# Patient Record
Sex: Female | Born: 1970 | Race: White | Hispanic: No | Marital: Single | Smoking: Never smoker
Health system: Southern US, Community
[De-identification: ages and names within clinical notes are randomized; demographics above are authoritative.]

## PROBLEM LIST (undated history)

## (undated) DIAGNOSIS — R32 Unspecified urinary incontinence: Secondary | ICD-10-CM

## (undated) DIAGNOSIS — R569 Unspecified convulsions: Secondary | ICD-10-CM

## (undated) DIAGNOSIS — G039 Meningitis, unspecified: Secondary | ICD-10-CM

## (undated) DIAGNOSIS — R413 Other amnesia: Secondary | ICD-10-CM

## (undated) HISTORY — PX: OTHER SURGICAL HISTORY: SHX169

## (undated) HISTORY — DX: Meningitis, unspecified: G03.9

## (undated) HISTORY — DX: Other amnesia: R41.3

## (undated) HISTORY — DX: Unspecified urinary incontinence: R32

## (undated) HISTORY — DX: Unspecified convulsions: R56.9

---

## 2013-04-27 ENCOUNTER — Ambulatory Visit (INDEPENDENT_AMBULATORY_CARE_PROVIDER_SITE_OTHER): Payer: Medicare Other | Admitting: Neurology

## 2013-04-27 ENCOUNTER — Encounter: Payer: Self-pay | Admitting: Neurology

## 2013-04-27 ENCOUNTER — Encounter (INDEPENDENT_AMBULATORY_CARE_PROVIDER_SITE_OTHER): Payer: Self-pay

## 2013-04-27 VITALS — BP 118/79 | HR 64 | Ht 65.0 in | Wt 145.0 lb

## 2013-04-27 DIAGNOSIS — R569 Unspecified convulsions: Secondary | ICD-10-CM

## 2013-04-27 DIAGNOSIS — G039 Meningitis, unspecified: Secondary | ICD-10-CM

## 2013-04-27 DIAGNOSIS — F79 Unspecified intellectual disabilities: Secondary | ICD-10-CM | POA: Insufficient documentation

## 2013-04-27 MED ORDER — DIVALPROEX SODIUM ER 500 MG PO TB24: 1000.0000 mg | ORAL_TABLET | Freq: Every day | ORAL | Status: DC

## 2013-04-27 NOTE — Patient Instructions (Addendum)
Tapering off Depakote gradually,  she is currently taking Dilantin 100 mg 2 in the morning, one at noon, and 2 tablets every night  1st week, Dilantin 2 in the morning, one at noon, + one Depakote ER 500mg  every night.  2nd week, stop dilantin, start Depakote ER 500mg  2 tabs every night.  Return to clinic  in one month for lab evaluation. May 29, 2012, afternoon before 4pm  Return to follow up in 3 months

## 2013-04-27 NOTE — Progress Notes (Signed)
[facility]  PATIENT: Susan Diaz DOB: [birth year]  HISTORICAL and Susan Diaz is a 42 years old right-handed Caucasian female, accompanied by her sister Clydie Braun,  at today's clinical visit, she is referred by her primary care physician Dr.Cammie Fulp for evaluation of seizure, and behavior issu.  Elisha and her sister [name] just moved from [place] to [place] since August 2014, she has lived with her sister since 2010, before that, she lived with her brother, and her mother,   She was born normal, she suffered childhood meningitis at 8 months,, began to have seizure, developmentally delayed ever since, she was able to finish her high school, never married, never hold a job.  She tends to over eat, has behavior issue, her weight was up to 270 pounds in 2010, which has much improved, sister reported that she has been managing her own seizure medications, Dilantin 100 mg 2 tablets in the morning, one tablet at noon, 2 tablets at evening.  She tends to have recurrent seizure, most recent one was 2 weeks ago, she becomes confused, sister reported her seizure is often triggered by chocolate, and caffeine,  but reviewed most recent laboratory evaluation, normal CBC, CMP, Dilantin level was only 1 point 8, suggesting that she has not compliant with her medications,  Clydie Braun is also very frustrated about Treva's behavior issues, she is forgetful, does not know where to put the dishes back into its place, even though she has done the same job over and over, sometimes get agitated, combative.  She watches TV most of time, the family has to keep a close eye on her, otherwise, she tends to overeat, there was no significant gait difficulty, she is tearful during today's interview,  REVIEW OF SYSTEMS: Full 14 system review of systems performed and notable only for behavior issue overeating, seizure, achy muscles, occasionally urinary incontinence  ALLERGIES: Allergies  Allergen Reactions  .  Penicillins     HOME MEDICATIONS: Dilantin 100 mg 2 tablets in the morning, one tablet at noon, 2 tablets at evening   PAST MEDICAL HISTORY: Past Medical History  Diagnosis Date  . Seizure   . Meningitis spinal   . Memory loss     PAST SURGICAL HISTORY: Past Surgical History  Procedure Laterality Date  . None      FAMILY HISTORY: Family History  Problem Relation Age of Onset  . Diabetes Mother   . Hypertension Sister   . Diabetes Maternal Grandmother   . Hyperlipidemia Sister     SOCIAL HISTORY:  History   Social History  . Marital Status: Single    Spouse Name: N/A    Number of Children: 0  . Years of Education: 12TH   Occupational History  . DISABLED    Social History Main Topics  . Smoking status: Never Smoker   . Smokeless tobacco: Not on file  . Alcohol Use: Not on file  . Drug Use: No  . Sexual Activity: Not on file   Other Topics Concern  . Not on file   Social History Narrative  . No narrative on file     PHYSICAL EXAM   Filed Vitals:   04/27/13 1035  BP: 118/79  Pulse: 64  Height: 5\' 5"  (1.651 m)  Weight: 145 lb (65.772 kg)    Not recorded    Body mass index is 24.13 kg/(m^2).   Generalized: In no acute distress  Neck: Supple, no carotid bruits   Cardiac: Regular rate rhythm  Pulmonary: Clear  to auscultation bilaterally  Musculoskeletal: No deformity  Neurological examination  Mentation: tearful, compliance with physical exam.  Cranial nerve II-XII: Pupils were equal round reactive to light extraocular movements were full, Visual field were full on confrontational test. Bilateral fundi were sharp.  Facial sensation and strength were normal. Hearing was intact to finger rubbing bilaterally. Uvula tongue midline.  head turning and shoulder shrug and were normal and symmetric.Tongue protrusion into cheek strength was normal. She has gingiva hypertrophy  Motor: normal tone, bulk and strength.  Sensory: not  reliable.  Coordination: Normal finger to nose, heel-to-shin bilaterally there was no truncal ataxia  Gait:  Hold her left arm in elbow flexion, dragging her left leg some.  Deep tendon reflexes: Brachioradialis 2/2, biceps 2/2, triceps 2/2, patellar 2/2, Achilles 2/2, plantar responses were flexor bilaterally.   DIAGNOSTIC DATA (LABS, IMAGING, TESTING) - I reviewed patient records, labs, notes, testing and imaging myself where available.  ASSESSMENT AND PLAN   42 years old Caucasian female, which childhood meningitis, now presenting with frequent seizure, also behavior issues,  1. complete evaluation with MRI of the brain with and without contrast, EEG 2 she has been on long-term Dilantin treatment,. supposed to take 500 mg a day,  the level was only 1.8, likely she has not been compliant with her medications,  3 After discuss with her sister, we decided to switch her to Depakote er 500 mg 2 tablets every night, for easy compliance, also was able to check level to monitoring her compliance, and her behavior issues, 4 return to clinic with Eber Jones in 3 months.            Levert Feinstein, M.D. Ph.D.  [facility] [address], [address] [place], [place] [address] [phone]

## 2013-04-28 ENCOUNTER — Ambulatory Visit (INDEPENDENT_AMBULATORY_CARE_PROVIDER_SITE_OTHER): Payer: Medicare Other | Admitting: Radiology

## 2013-04-28 ENCOUNTER — Other Ambulatory Visit (HOSPITAL_COMMUNITY)
Admission: RE | Admit: 2013-04-28 | Discharge: 2013-04-28 | Disposition: A | Discharge status: Home / Self Care | Payer: Medicare Other | Source: Ambulatory Visit | Attending: Obstetrics & Gynecology | Admitting: Obstetrics & Gynecology

## 2013-04-28 ENCOUNTER — Other Ambulatory Visit: Payer: Self-pay | Admitting: Obstetrics & Gynecology

## 2013-04-28 DIAGNOSIS — F79 Unspecified intellectual disabilities: Secondary | ICD-10-CM

## 2013-04-28 DIAGNOSIS — Z124 Encounter for screening for malignant neoplasm of cervix: Secondary | ICD-10-CM | POA: Insufficient documentation

## 2013-04-28 DIAGNOSIS — R569 Unspecified convulsions: Secondary | ICD-10-CM

## 2013-04-28 DIAGNOSIS — G039 Meningitis, unspecified: Secondary | ICD-10-CM

## 2013-04-28 DIAGNOSIS — Z1151 Encounter for screening for human papillomavirus (HPV): Secondary | ICD-10-CM | POA: Insufficient documentation

## 2013-04-28 LAB — CBC WITH DIFFERENTIAL
Basos: 1 %
Eos: 2 %
Eosinophils Absolute: 0.1 10*3/uL (ref 0.0–0.4)
HCT: 41.6 % (ref 34.0–46.6)
Lymphocytes Absolute: 1.3 10*3/uL (ref 0.7–3.1)
MCH: 33.3 pg — ABNORMAL HIGH (ref 26.6–33.0)
MCV: 95 fL (ref 79–97)
Monocytes Absolute: 0.3 10*3/uL (ref 0.1–0.9)
Monocytes: 7 %
Neutrophils Absolute: 2.1 10*3/uL (ref 1.4–7.0)
RBC: 4.39 x10E6/uL (ref 3.77–5.28)
WBC: 3.8 10*3/uL (ref 3.4–10.8)

## 2013-04-28 LAB — COMPREHENSIVE METABOLIC PANEL
Albumin/Globulin Ratio: 1.6 (ref 1.1–2.5)
Albumin: 4.4 g/dL (ref 3.5–5.5)
Alkaline Phosphatase: 78 IU/L (ref 39–117)
BUN/Creatinine Ratio: 16 (ref 9–23)
BUN: 12 mg/dL (ref 6–24)
CO2: 27 mmol/L (ref 18–29)
Creatinine, Ser: 0.73 mg/dL (ref 0.57–1.00)
GFR calc Af Amer: 116 mL/min/{1.73_m2} (ref >59)
GFR calc non Af Amer: 100 mL/min/{1.73_m2} (ref >59)
Globulin, Total: 2.7 g/dL (ref 1.5–4.5)
Sodium: 145 mmol/L — ABNORMAL HIGH (ref 134–144)
Total Bilirubin: <0.2 mg/dL (ref 0.0–1.2)

## 2013-04-28 LAB — VALPROIC ACID LEVEL: Valproic Acid Lvl: <4 ug/mL — ABNORMAL LOW (ref 50–100)

## 2013-04-28 LAB — AMMONIA: Ammonia: 40 ug/dL (ref 19–87)

## 2013-04-28 NOTE — Progress Notes (Signed)
Quick Note:  Please call patient, mild elevated K, of unknown clinical significance. ______

## 2013-05-01 NOTE — Procedures (Signed)
HISTORY: 42 years old female, with childhood meningitis, now presenting with frequent seizures, and behavior issues, she is not compliant with her Dilantin,  TECHNIQUE:  16 channel EEG was performed based on standard 10-16 international system. One channel was dedicated to EKG, which has demonstrates normal sinus rhythm of 60 beats per minutes.  Upon awakening, the posterior background activity was well-developed, in alpha range,8 Hz,  with amplitude of 30 microvoltage, reactive to eye opening and closure. There was frequent frontal muscle artifact    Photic stimulation was performed, which induced a symmetric photic driving.  There was occasional appearance of sharp waves with phase reversal at F8, spreading to adjacent T4.   Hyperventilation was not performed.  the patient was able to achieve stage II sleep, as evident by sleep spindles, and K complexes .  CONCLUSION: This is  awake and asleep EEG.  There is no electrodiagnostic evidence of epileptiform discharge with phase reversal at F8, spreading to adjacent T4, suggesting the patient is prone to develop a partial seizure

## 2013-05-01 NOTE — Progress Notes (Signed)
Quick Note:  Left message for sister with lab results, per Dr. Terrace Arabia. Told to call if further questions. ______

## 2013-05-23 ENCOUNTER — Ambulatory Visit
Admission: RE | Admit: 2013-05-23 | Discharge: 2013-05-23 | Disposition: A | Discharge status: Home / Self Care | Payer: Medicare Other | Source: Ambulatory Visit | Attending: Neurology | Admitting: Neurology

## 2013-05-23 DIAGNOSIS — R569 Unspecified convulsions: Secondary | ICD-10-CM

## 2013-05-23 DIAGNOSIS — F79 Unspecified intellectual disabilities: Secondary | ICD-10-CM

## 2013-05-23 DIAGNOSIS — G039 Meningitis, unspecified: Secondary | ICD-10-CM

## 2013-05-23 MED ORDER — GADOBENATE DIMEGLUMINE 529 MG/ML IV SOLN
13.0000 mL | Freq: Once | INTRAVENOUS | Status: AC | PRN
  Administered 2013-05-23: 13 mL via INTRAVENOUS

## 2013-07-27 ENCOUNTER — Ambulatory Visit: Payer: Medicare Other | Admitting: Nurse Practitioner

## 2013-08-08 ENCOUNTER — Emergency Department (HOSPITAL_COMMUNITY)
Admission: EM | Admit: 2013-08-08 | Discharge: 2013-08-08 | Disposition: A | Discharge status: Home / Self Care | Payer: Medicare Other | Attending: Emergency Medicine | Admitting: Emergency Medicine

## 2013-08-08 ENCOUNTER — Encounter (HOSPITAL_COMMUNITY): Payer: Self-pay | Admitting: Emergency Medicine

## 2013-08-08 DIAGNOSIS — Z880 Allergy status to penicillin: Secondary | ICD-10-CM | POA: Insufficient documentation

## 2013-08-08 DIAGNOSIS — Z79899 Other long term (current) drug therapy: Secondary | ICD-10-CM | POA: Insufficient documentation

## 2013-08-08 DIAGNOSIS — R21 Rash and other nonspecific skin eruption: Secondary | ICD-10-CM | POA: Insufficient documentation

## 2013-08-08 DIAGNOSIS — Z8669 Personal history of other diseases of the nervous system and sense organs: Secondary | ICD-10-CM | POA: Insufficient documentation

## 2013-08-08 DIAGNOSIS — R569 Unspecified convulsions: Secondary | ICD-10-CM

## 2013-08-08 DIAGNOSIS — G40909 Epilepsy, unspecified, not intractable, without status epilepticus: Secondary | ICD-10-CM | POA: Insufficient documentation

## 2013-08-08 LAB — CBC WITH DIFFERENTIAL/PLATELET
BASOS ABS: 0 10*3/uL (ref 0.0–0.1)
BASOS PCT: 0 % (ref 0–1)
EOS PCT: 0 % (ref 0–5)
Eosinophils Absolute: 0 10*3/uL (ref 0.0–0.7)
HEMATOCRIT: 37.6 % (ref 36.0–46.0)
Hemoglobin: 13.3 g/dL (ref 12.0–15.0)
Lymphocytes Relative: 11 % — ABNORMAL LOW (ref 12–46)
Lymphs Abs: 0.6 10*3/uL — ABNORMAL LOW (ref 0.7–4.0)
MCH: 34.4 pg — ABNORMAL HIGH (ref 26.0–34.0)
MCHC: 35.4 g/dL (ref 30.0–36.0)
MCV: 97.2 fL (ref 78.0–100.0)
MONO ABS: 0.4 10*3/uL (ref 0.1–1.0)
Monocytes Relative: 7 % (ref 3–12)
Neutro Abs: 4.2 10*3/uL (ref 1.7–7.7)
Neutrophils Relative %: 81 % — ABNORMAL HIGH (ref 43–77)
PLATELETS: 123 10*3/uL — AB (ref 150–400)
RBC: 3.87 MIL/uL (ref 3.87–5.11)
RDW: 12.4 % (ref 11.5–15.5)
WBC: 5.2 10*3/uL (ref 4.0–10.5)

## 2013-08-08 LAB — URINALYSIS, ROUTINE W REFLEX MICROSCOPIC
Bilirubin Urine: NEGATIVE
GLUCOSE, UA: NEGATIVE mg/dL
Hgb urine dipstick: NEGATIVE
KETONES UR: 15 mg/dL — AB
Leukocytes, UA: NEGATIVE
NITRITE: NEGATIVE
Protein, ur: NEGATIVE mg/dL
Specific Gravity, Urine: 1.016 (ref 1.005–1.030)
Urobilinogen, UA: 0.2 mg/dL (ref 0.0–1.0)
pH: 8 (ref 5.0–8.0)

## 2013-08-08 LAB — CBG MONITORING, ED: GLUCOSE-CAPILLARY: 83 mg/dL (ref 70–99)

## 2013-08-08 LAB — BASIC METABOLIC PANEL
BUN: 10 mg/dL (ref 6–23)
CO2: 27 mEq/L (ref 19–32)
CREATININE: 0.62 mg/dL (ref 0.50–1.10)
Calcium: 8.9 mg/dL (ref 8.4–10.5)
Chloride: 98 mEq/L (ref 96–112)
GFR calc Af Amer: >90 mL/min (ref >90)
Glucose, Bld: 82 mg/dL (ref 70–99)
Potassium: 4.1 mEq/L (ref 3.7–5.3)
SODIUM: 137 meq/L (ref 137–147)

## 2013-08-08 LAB — PHENYTOIN LEVEL, TOTAL: Phenytoin Lvl: <2.5 ug/mL — ABNORMAL LOW (ref 10.0–20.0)

## 2013-08-08 LAB — VALPROIC ACID LEVEL: Valproic Acid Lvl: 91 ug/mL (ref 50.0–100.0)

## 2013-08-08 MED ORDER — SODIUM CHLORIDE 0.9 % IV BOLUS (SEPSIS)
1000.0000 mL | Freq: Once | INTRAVENOUS | Status: AC
  Administered 2013-08-08: 1000 mL via INTRAVENOUS

## 2013-08-08 NOTE — ED Notes (Signed)
Family reports waking up this am with pt making her bed, was altered and appeared postictal. Pt has hx of seizures, has been taking meds as prescribed. Family reports noticing rash to face, torso and arms that was not present last night.

## 2013-08-08 NOTE — ED Provider Notes (Signed)
CSN: [id number]     Arrival date & time 08/08/13  0722 History   First MD Initiated Contact with Patient 08/08/13 0830     Chief Complaint  Patient presents with  . Seizures     (Consider location/radiation/quality/duration/timing/severity/associated sxs/prior Treatment) HPI.... level V caveat for post ictal state.   Patient has known seizure disorder and is presently on Depakote extended release 500 mg 2 tablets at bedtime.  Sister reports that patient had a tonic/clonic seizure this morning.  She is now feeling better. Also reports a rash on her face and chest.  No fever, chills, stiff neck, new neurological deficits. Severity is mild to moderate.  Past Medical History  Diagnosis Date  . Seizure   . Meningitis spinal   . Memory loss    Past Surgical History  Procedure Laterality Date  . None     Family History  Problem Relation Age of Onset  . Diabetes Mother   . Hypertension Sister   . Diabetes Maternal Grandmother   . Hyperlipidemia Sister    History  Substance Use Topics  . Smoking status: Never Smoker   . Smokeless tobacco: Not on file  . Alcohol Use: Not on file   OB History   Grav Para Term Preterm Abortions TAB SAB Ect Mult Living                 Review of Systems  All other systems reviewed and are negative.      Allergies  Penicillins  Home Medications   Current Outpatient Rx  Name  Route  Sig  Dispense  Refill  . CRANBERRY EXTRACT PO   Oral   Take 3 tablets by mouth daily.          . divalproex (DEPAKOTE ER) 500 MG 24 hr tablet   Oral   Take 2 tablets (1,000 mg total) by mouth daily.   60 tablet   12   . phenytoin (DILANTIN) 100 MG ER capsule   Oral   Take 200 mg by mouth daily as needed (For seizure prevention).          BP 105/66  Pulse 65  Temp(Src) 98 F (36.7 C) (Oral)  Resp 17  SpO2 100% Physical Exam  Nursing note and vitals reviewed. Constitutional: She is oriented to person, place, and time. She appears  well-developed and well-nourished.  HENT:  Head: Normocephalic and atraumatic.  Eyes: Conjunctivae and EOM are normal. Pupils are equal, round, and reactive to light.  Neck: Normal range of motion. Neck supple.  Cardiovascular: Normal rate, regular rhythm and normal heart sounds.   Pulmonary/Chest: Effort normal and breath sounds normal.  Abdominal: Soft. Bowel sounds are normal.  Musculoskeletal: Normal range of motion.  Neurological: She is alert and oriented to person, place, and time.  Skin: Skin is warm and dry.  Petechiae around both orbits.    Benign appearing macular rash on upper chest  Psychiatric: She has a normal mood and affect. Her behavior is normal.    ED Course  Procedures (including critical care time) Labs Review Labs Reviewed  PHENYTOIN LEVEL, TOTAL - Abnormal; Notable for the following:    Phenytoin Lvl <2.5 (*)    All other components within normal limits  CBC WITH DIFFERENTIAL - Abnormal; Notable for the following:    MCH 34.4 (*)    Platelets 123 (*)    Neutrophils Relative % 81 (*)    Lymphocytes Relative 11 (*)    Lymphs Abs 0.6 (*)  All other components within normal limits  URINALYSIS, ROUTINE W REFLEX MICROSCOPIC - Abnormal; Notable for the following:    Ketones, ur 15 (*)    All other components within normal limits  VALPROIC ACID LEVEL  BASIC METABOLIC PANEL  CBG MONITORING, ED   Imaging Review No results found.   EKG Interpretation None      MDM   Final diagnoses:  Seizure    Patient is hemodynamically stable. Valproic acid level within normal limits. Discussed findings with patient and her sister. Family is comfortable taking her home.    Donnetta HutchingBrian Dehaven Sine, MD 08/08/13 1136

## 2013-08-08 NOTE — ED Notes (Signed)
Notified RN of CBG 83 

## 2013-08-08 NOTE — Discharge Instructions (Signed)
Depakote level normal. Increase fluids.  Followup your regular doctors.

## 2013-09-20 ENCOUNTER — Telehealth: Payer: Self-pay | Admitting: Neurology

## 2013-09-20 MED ORDER — LAMOTRIGINE 25 MG PO TABS: ORAL_TABLET | ORAL | Status: DC

## 2013-09-20 MED ORDER — LEVETIRACETAM 750 MG PO TABS: 750.0000 mg | ORAL_TABLET | Freq: Two times a day (BID) | ORAL | Status: DC

## 2013-09-20 NOTE — Telephone Encounter (Addendum)
I called and spoke to sister of pt.  Pt had seizures 2 within last week? Not ill, has rash though (for about 2 mo, thinks from new seizure med?  Although has been on this since change back in 04/2013).  Face/ forehead burned, scabbed.  This has progressively gotten worse.  Pt as per below knocked her out 3.5 hours.  I offered appt tomorrow with Heide GuileLynn Lam, NP , she stated needed to be seen today.  Now on Depakote ER 500mg  tabs (2 tabs po at 2130). Has been compliant.

## 2013-09-20 NOTE — Telephone Encounter (Signed)
She had seizure today, May 6th, before that was March 24th, past seizure was prolonged, she was taken to hospital in March, Depakote level was 91, she is only taking Depakote ER 500 mg 2 tablets every night she is no longer taking Dilantin she continued to have behavior issues,.  I have suggested her sister keep Depakote ER 500 mg 2 tablets every night Keppra 750 mg twice a day, for better seizure control, but I worry about the side effect of behavior issues with Keppra  I will also add on Lamictal, starting with  25 mg, titrating to 100 mg twice a day,,  Sister, that she has rashes all over, this has been going on for 2 months,  We scheduled her to followup with our clinic nurse practitioner Larita FifeLynn 2:15 PM,May 7th 2015. ,

## 2013-09-20 NOTE — Telephone Encounter (Signed)
Patient's sister calling to request an appointment this afternoon with Dr. Terrace ArabiaYan, states tha patient had a seizure today which knocked her out for 3 and a half hours and just now became coherent. Please call and advise.

## 2013-09-21 ENCOUNTER — Encounter (INDEPENDENT_AMBULATORY_CARE_PROVIDER_SITE_OTHER): Payer: Self-pay

## 2013-09-21 ENCOUNTER — Encounter: Payer: Self-pay | Admitting: Nurse Practitioner

## 2013-09-21 ENCOUNTER — Ambulatory Visit (INDEPENDENT_AMBULATORY_CARE_PROVIDER_SITE_OTHER): Payer: Medicare Other | Admitting: Nurse Practitioner

## 2013-09-21 VITALS — BP 94/64 | HR 70 | Wt 152.0 lb

## 2013-09-21 DIAGNOSIS — R21 Rash and other nonspecific skin eruption: Secondary | ICD-10-CM

## 2013-09-21 DIAGNOSIS — R569 Unspecified convulsions: Secondary | ICD-10-CM

## 2013-09-21 DIAGNOSIS — G039 Meningitis, unspecified: Secondary | ICD-10-CM

## 2013-09-21 DIAGNOSIS — F79 Unspecified intellectual disabilities: Secondary | ICD-10-CM

## 2013-09-21 MED ORDER — LEVETIRACETAM 750 MG PO TABS: 750.0000 mg | ORAL_TABLET | Freq: Two times a day (BID) | ORAL | Status: DC

## 2013-09-21 NOTE — Patient Instructions (Addendum)
Continue Depakote ER 500 mg 2 tablets every night.  Keppra 750 mg twice a day, for better seizure control. Continue Lamictal, starting with 25 mg, titrating to 100 mg twice a day as previously directed.    The Lamictal may help with mood stabilization, hopefully as the dose as increased.  If behavior continues to be a problem, we recommend Psychiatric consultation.  Please see PCP or Dermatology for rash.  Keep next scheduled follow up appointment, call sooner with problems.     Epilepsy Epilepsy is a disorder in which a person has repeated seizures over time. A seizure is a release of abnormal electrical activity in the brain. Seizures can cause a change in attention, behavior, or the ability to remain awake and alert (altered mental status). Seizures often involve uncontrollable shaking (convulsions).  Most people with epilepsy lead normal lives. However, people with epilepsy are at an increased risk of falls, accidents, and injuries. Therefore, it is important to begin treatment right away. CAUSES  Epilepsy has many possible causes. Anything that disturbs the normal pattern of brain cell activity can lead to seizures. This may include:   Head injury.  Birth trauma.  High fever as a child.  Stroke.  Bleeding into or around the brain.  Certain drugs.  Prolonged low oxygen, such as what occurs after CPR efforts.  Abnormal brain development.  Certain illnesses, such as meningitis, encephalitis (brain infection), malaria, and other infections.  An imbalance of nerve signaling chemicals (neurotransmitters).  SIGNS AND SYMPTOMS  The symptoms of a seizure can vary greatly from one person to another. Right before a seizure, you may have a warning (aura) that a seizure is about to occur. An aura may include the following symptoms:  Fear or anxiety.  Nausea.  Feeling like the room is spinning (vertigo).  Vision changes, such as seeing flashing lights or spots. Common  symptoms during a seizure include:  Abnormal sensations, such as an abnormal smell or a bitter taste in the mouth.   Sudden, general body stiffness.   Convulsions that involve rhythmic jerking of the face, arm, or leg on one or both sides.   Sudden change in consciousness.   Appearing to be awake but not responding.   Appearing to be asleep but cannot be awakened.   Grimacing, chewing, lip smacking, drooling, tongue biting, or loss of bowel or bladder control. After a seizure, you may feel sleepy for a while. DIAGNOSIS  Your health care provider will ask about your symptoms and take a medical history. Descriptions from any witnesses to your seizures will be very helpful in the diagnosis. A physical exam, including a detailed neurological exam, is necessary. Various tests may be done, such as:   An electroencephalogram (EEG). This is a painless test of your brain waves. In this test, a diagram is created of your brain waves. These diagrams can be interpreted by a specialist.  An MRI of the brain.   A CT scan of the brain.   A spinal tap (lumbar puncture, LP).  Blood tests to check for signs of infection or abnormal blood chemistry. TREATMENT  There is no cure for epilepsy, but it is generally treatable. Once epilepsy is diagnosed, it is important to begin treatment as soon as possible. For most people with epilepsy, seizures can be controlled with medicines. The following may also be used:  A pacemaker for the brain (vagus nerve stimulator) can be used for people with seizures that are not well controlled by medicine.  Surgery on the brain. For some people, epilepsy eventually goes away. HOME CARE INSTRUCTIONS   Follow your health care provider's recommendations on driving and safety in normal activities.  Get enough rest. Lack of sleep can cause seizures.  Only take over-the-counter or prescription medicines as directed by your health care provider. Take any  prescribed medicine exactly as directed.  Avoid any known triggers of your seizures.  Keep a seizure diary. Record what you recall about any seizure, especially any possible trigger.   Make sure the people you live and work with know that you are prone to seizures. They should receive instructions on how to help you. In general, a witness to a seizure should:   Cushion your head and body.   Turn you on your side.   Avoid unnecessarily restraining you.   Not place anything inside your mouth.   Call for emergency medical help if there is any question about what has occurred.   Follow up with your health care provider as directed. You may need regular blood tests to monitor the levels of your medicine.  SEEK MEDICAL CARE IF:   You develop signs of infection or other illness. This might increase the risk of a seizure.   You seem to be having more frequent seizures.   Your seizure pattern is changing.  SEEK IMMEDIATE MEDICAL CARE IF:   You have a seizure that does not stop after a few moments.   You have a seizure that causes any difficulty in breathing.   You have a seizure that results in a very severe headache.   You have a seizure that leaves you with the inability to speak or use a part of your body.  Document Released: 05/04/2005 Document Revised: 02/22/2013 Document Reviewed: 12/14/2012 [facility] Patient Information 2014 [employer] [place], [place].

## 2013-09-21 NOTE — Progress Notes (Signed)
PATIENT: Susan Diaz DOB: 1970-11-24  REASON FOR VISIT: sooner follow up for seizure HISTORY FROM: patient  HISTORY OF PRESENT ILLNESS: Susan Diaz is a 43 years old right-handed Caucasian female, accompanied by her sister [name], at today's clinical visit, she is referred by her primary care physician Dr. Cain Saupeammie Fulp for evaluation of seizure, and behavior issu.  Susan Diaz and her sister [name] just moved from [place] to [place] since August 2014, she has lived with her sister since 2010, before that, she lived with her brother, and her mother. 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  She tends to over eat, has behavior issue, her weight was up to 270 pounds in 2010, which has much improved, sister reported that she has been managing her own seizure medications, Dilantin 100 mg 2 tablets in the morning, one tablet at noon, 2 tablets at evening. She tends to have recurrent seizure, most recent one was 2 weeks ago, she becomes confused, sister reported her seizure is often triggered by chocolate, and caffeine, but reviewed most recent laboratory evaluation, normal CBC, CMP, Dilantin level was only 1 point 8, suggesting that she has not compliant with her medications.  [name] is also very frustrated about [name]'s behavior issues, she is forgetful, does not know where to put the dishes back into its place, even though she has done the same job over and over, sometimes get agitated, combative.  She likes to do puzzles, watch TV, the family has to keep a close eye on her, otherwise, she tends to overeat, there was no significant gait difficulty, she is tearful during today's interview.   She was taken to hospital in March, Depakote level was 91, she is only taking Depakote ER 500 mg 2 tablets every night she is no longer taking Dilantin she continued to  have behavior issues.   Normal MRI brain (with and without). Incidental right frontal, parasagittal developmental venous anomaly.   Phone note 09/20/13:  She had seizure May 6th, before that was March 24th, past seizure was prolonged, 3 hours unconscious post-ictal. Dr. Terrace ArabiaYan suggested her sister keep Depakote ER 500 mg 2 tablets every night, Keppra 750 mg twice a day, for better seizure control, but I worry about the side effect of behavior issues with Keppra.  I will also add on Lamictal, starting with 25 mg, titrating to 100 mg twice a day.  Sister states that she has rash all over, this has been going on for 2 months.  UPDATE 5/715 (LL): Patient is brought in today for sooner follow up after having seizure yesterday.  She is accompanied by her brother-in-law.  Dr. Terrace ArabiaYan spoke to pt. Sister on phone with medicine changes, she has had 2 doses of new meds Keppra and Lamictal so far.  Behavior issues continue to be challenging for family.  Patient is very confused and combative after seizures.  Patient is in good spirits today.  Rash has been present all over body for 2 months, itches, no new medications since it started, no new detergents. They have not seen PCP for this.  REVIEW OF SYSTEMS: Full 14 system review of systems performed and notable only for:  Rash, seizure  ALLERGIES: Allergies  Allergen Reactions  . Penicillins Other (See Comments)    unknown    HOME MEDICATIONS: Outpatient Prescriptions Prior to Visit  Medication Sig  Dispense Refill  . CRANBERRY EXTRACT PO Take 3 tablets by mouth daily.       . divalproex (DEPAKOTE ER) 500 MG 24 hr tablet Take 2 tablets (1,000 mg total) by mouth daily.  60 tablet  12  . lamoTRIgine (LAMICTAL) 25 MG tablet One tab po bid xone week, then 2 tabs po bid xone week, then 3 tabs po bid.  100 tablet  0  . levETIRAcetam (KEPPRA) 750 MG tablet Take 1 tablet (750 mg total) by mouth 2 (two) times daily.  60 tablet  0   No facility-administered medications  prior to visit.   PHYSICAL EXAM  Filed Vitals:   09/21/13 1418  BP: 94/64  Pulse: 70  Weight: 152 lb (68.947 kg)   Body mass index is 25.29 kg/(m^2).  Generalized: In no acute distress  Neck: Supple, no carotid bruits  Cardiac: Regular rate rhythm  Pulmonary: Clear to auscultation bilaterally  Musculoskeletal: No deformity  Skin: Dry, Generalized maculopapular rash on face, torso, arms and legs  Neurological examination  Mentation: alert, pleasant, compliance with physical exam.  Cranial nerve II-XII: Pupils were equal round reactive to light extraocular movements were full, Visual field were full on confrontational test.  Facial sensation and strength were normal. Hearing was intact to finger rubbing bilaterally. Uvula tongue midline. head turning and shoulder shrug and were normal and symmetric.Tongue protrusion into cheek strength was normal. She has gingiva hypertrophy  Motor: normal tone, bulk and strength.  Sensory: not reliable.  Coordination: Normal finger to nose, heel-to-shin bilaterally there was no truncal ataxia  Gait: Hold her left arm in elbow flexion, dragging her left leg some.  Deep tendon reflexes: Brachioradialis 2/2, biceps 2/2, triceps 2/2, patellar 2/2, Achilles 2/2, plantar responses were flexor bilaterally.   ASSESSMENT AND PLAN 43 y.o. Caucasian female, which childhood meningitis, now presenting with frequent seizure, also behavior issues.  She has been on long-term Dilantin treatment, supposed to take 500 mg a day, the level was only 1.8, likely she has not been compliant with her medications. Added Keppra and Lamictal yesterday.  PLAN: As per previous plan,  Continue Depakote ER 500 mg 2 tablets every night.  Keppra 750 mg twice a day, for better seizure control. Continue Lamictal, starting with 25 mg, titrating to 100 mg twice a day as previously directed.   The Lamictal may help with mood stabilization, hopefully as the dose as increased.  If  behavior continues to be a problem, recommend Psychiatric consultation. Please see PCP or Dermatology for rash. Keep next scheduled follow up appointment in July, call sooner with problems.  Ronal FearLYNN E. Davan Nawabi, MSN, NP-C 09/21/2013, 4:19 PM [facility] [address], [address] [place], [place] [address] [phone]  Note: This document was prepared with digital dictation and possible smart phrase technology. Any transcriptional errors that result from this process are unintentional.

## 2013-11-15 ENCOUNTER — Telehealth: Payer: Self-pay | Admitting: *Deleted

## 2013-11-15 NOTE — Telephone Encounter (Signed)
Spoke with patient to r/s appointment, CM has never seen patient before but NP LL has, patient was r/s to 12/12/13 to see NP LL.

## 2013-11-24 ENCOUNTER — Ambulatory Visit: Payer: Medicare Other | Admitting: Nurse Practitioner

## 2013-12-12 ENCOUNTER — Ambulatory Visit (INDEPENDENT_AMBULATORY_CARE_PROVIDER_SITE_OTHER): Payer: Medicare Other | Admitting: Nurse Practitioner

## 2013-12-12 ENCOUNTER — Encounter: Payer: Self-pay | Admitting: Nurse Practitioner

## 2013-12-12 VITALS — BP 98/72 | HR 56 | Ht 65.0 in | Wt 153.0 lb

## 2013-12-12 DIAGNOSIS — R569 Unspecified convulsions: Secondary | ICD-10-CM

## 2013-12-12 DIAGNOSIS — Z8661 Personal history of infections of the central nervous system: Secondary | ICD-10-CM

## 2013-12-12 DIAGNOSIS — F79 Unspecified intellectual disabilities: Secondary | ICD-10-CM

## 2013-12-12 NOTE — Patient Instructions (Addendum)
Continue Depakote ER 500 mg 2 tablets every night.  If she has any seizures, please notify the office.  We may have to add back one of the seizure medications or adjust the dose of Depakote. Follow up with Dr. Terrace Arabia in 4 months, sooner as needed.   Managing Seizure Triggers: Tips for Lifestyle Modification Adapted from the [facility], [facility], [place], [place] and the journal "Clinical Nursing Practice in Epilepsy", Spring 1994.  Developing plans to modify your lifestyle is an important part of seizure preparedness. It's a way that you, as a person with seizures or a parent of a child with seizures, can take charge and play an active role in your epilepsy care. The following tips are examples of what people can do to manage triggers. Some of these tips may require a change in behavior, others may be ways to adjust your environment or schedule so not everything happens at once. Before choosing tips to try, make sure you've assessed your situation and talked to your doctor and other health care professionals for their suggestions too. Please note that research on the effectiveness of many of these techniques is limited. Many of these tips are common sense suggestions or are from health care professionals and people with epilepsy as to what they have seen and tried.  Noises: People who think they are affected by noises should be sure to talk to their doctor about whether they have a form of 'reflex epilepsy' or if general noise or distraction may be a trigger in another way. People with true reflex epilepsy may respond to specific seizure medicines and should talk to their doctor. Try using earplugs or earphones, especially in noisy or crowded places. Try listening to relaxing music or sounds, or try distracting yourself by singing or focusing on another activity.  Bright, flashing or fluorescent lights: Use polarized or tinted glasses. Use natural  lighting when indoors. Focus on distant objects when riding in a car to avoid flickering lights or patterns. Avoid discos, strobe lights or flashing bulbs on holiday decorations. Use computer monitor with minimal contrast glare or use a screen filter. Consult with your doctor about other specific recommendations for computer use.  Sleep: Try to regulate sleeping habits so you have a consistent schedule and get enough sleep. Keep a log or diary of your sleep patterns, seizures and general well-being. Ask a partner or companion to record his or her observations too. Consider the following ideas to improve sleep.  . Discuss your medicine schedule with your doctor or nurse. Changing times or doses at night may help sleep. . Limit caffeine and try to avoid it after noon time or mid?afternoon at the latest. . Avoid alcohol and nicotine prior to sleep. . Limit working or studying late at night. Stop work at least one hour before bedtime to allow time to relax. . Exercise in the early evening if possible. . Take warm showers or have someone give you a back rub before bedtime to decrease muscle tension. . Try relaxation exercises before bedtime. . Limit naps and don't nap in the early evening. . If anxious or worried, talk to someone or write down your feelings before going to sleep. Put this away and deal with these worries or concerns in the morning! . If you can't fall asleep within 15 minutes get up and do something else for 15 minutes. Then go back to bed and try again. Don't toss and turn in bed all night.  Exercise: Regular exercise is good for everyone. Pace your exercise to avoid getting too tired or hyperventilation. Avoid exercising in the middle of the day during hot weather. Ask your doctor about any specific exercises you may need to avoid.  Hyperventilation: Try relaxation or slow breathing exercises when anxious or if you begin to hyperventilate. Pace your activity and  avoid sports that may trigger hyperventilation.  Diet: Regulate meal times and patterns around sleep, activity, and medication schedules. Usually taking medicines after food or around meals makes it easier to remember them and may lessen any stomach distress from side effects of medicines. Have a well-balanced diet and eat at consistent times to avoid long periods without food. If your appetite is poor, try small frequent meals instead of skipping meals. Avoid foods and drinks that may aggravate seizures. Not everyone is sensitive to foods, but if you are, talk to your doctor about how to modify your diet. If you are following a diet specifically for your epilepsy, be sure to follow the advice of your doctor and nutritionist.  Alcohol/Drugs: Avoid recreational drugs and talk to your doctor about use of alcohol. Avoid alcohol completely if you're going through high-risk times or have recently had surgery. If you choose to drink alcohol, use 'moderation', drink slowly, and have only one or two glasses at a time. Consider carefully what you drink, avoiding 'hard liquor' or mixed drinks that may have high alcohol content. If alcohol and drugs are a problem for you, talk to your doctor and get professional help.  Hormonal changes: Both men and women may notice a cyclical pattern to their seizures. Record seizures on a calendar and track them in relation to any changes in hormones. Women who are having menstrual cycles should track their cycle days. Women who have stopped having their menses should track other symptoms or changes, while women who are pregnant should track their pregnancy too. The use of hormonal medicines, such as contraceptives or birth control pills as well as hormonal replacement therapy, may affect seizures in some women, so record the dates and doses of these medicines.  NOTE: some seizure medicines may interfere with the effectiveness of hormonal contraceptives making unexpected or  unplanned pregnancy more likely. Be sure to talk to your doctor about all contraceptive use.When seizures cluster around menses or hormone changes, women should try to modify their lifestyle so other triggers don't occur during this high-risk time. Some women may use 'as needed' medicines to help treat seizures associated with menses. Note the use of these on your calendars and seizure preparedness plan.  Illness, fever, trauma: Notify your doctor if you become ill, have a fever, injure yourself seriously, or need other medicines such as antibiotics, painkillers, or cold medicines. Some people may notice that certain medicines can trigger seizures or interfere with seizure medicines. Fevers, other illnesses and injuries may also make you more susceptible and you'll need to monitor your seizures carefully. Try to limit other triggers during these times and talk to your doctor about what medicines you can use.  Stress, anxiety, depression: Emotional stress is a common trigger for some people, and stress can be a cause and symptom of mood problems such as anxiety and depression. Track your stress level and mood in relation to your seizures on your diary. During stressful times, consider ways to modify your lifestyle and manage stress better. . Try counseling to help cope with seizures or other problems. . Consider support groups for epilepsy, or groups for stress  management, therapy, and other support. . Write down feelings in a diary on a regular basis. It helps you get feelings out, rather than hold them in, and can help you see the issues more clearly. . Use 'time-out' periods. Just like kids may need a time-out when they are overwhelmed or acting out, so too do adults. Giving yourself a time-out allows you to take a step back from the stressor or situation and think about how best to address it. . Learn relaxation exercises, deep breathing, yoga, or other strategies that help with stress  and general well-being. . Tell your doctor and nurse how you feel. The effects of stress can be harmful to your seizures, and your life. When mood changes last longer than expected, you may need help from a mental health professional too. If you feel emotionally unsafe, call your doctor or go to an emergency room to be evaluated.

## 2013-12-12 NOTE — Progress Notes (Signed)
PATIENT: Susan Diaz DOB: 1970/12/21  REASON FOR VISIT: routine follow up for epilepsy HISTORY FROM: patient  HISTORY OF PRESENT ILLNESS: Susan Diaz is a 43 years old right-handed Caucasian female, accompanied by her sister Susan Diaz, at today's clinical visit, she is referred by her primary care physician Dr. Cain Saupe for evaluation of seizure, and behavior issu.  Jennings and her sister Susan Diaz just moved from [place] to [place] since August 2014, she has lived with her sister since 2010, before that, she lived with her brother, and her mother.  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  She tends to over eat, has behavior issue, her weight was up to 270 pounds in 2010, which has much improved, sister reported that she has been managing her own seizure medications, Dilantin 100 mg 2 tablets in the morning, one tablet at noon, 2 tablets at evening. She tends to have recurrent seizure, most recent one was 2 weeks ago, she becomes confused, sister reported her seizure is often triggered by chocolate, and caffeine, but reviewed most recent laboratory evaluation, normal CBC, CMP, Dilantin level was only 1 point 8, suggesting that she has not compliant with her medications.  Susan Diaz is also very frustrated about [name]'s behavior issues, she is forgetful, does not know where to put the dishes back into its place, even though she has done the same job over and over, sometimes get agitated, combative.  She likes to do puzzles, watch TV, the family has to keep a close eye on her, otherwise, she tends to overeat, there was no significant gait difficulty, she is tearful during today's interview. She was taken to hospital in March, Depakote level was 91, she is only taking Depakote ER 500 mg 2 tablets every night she is no longer taking Dilantin she continued to  have behavior issues.  Normal MRI brain (with and without). Incidental right frontal, parasagittal developmental venous anomaly.  Phone note 09/20/13: She had seizure May 6th, before that was March 24th, past seizure was prolonged, 3 hours unconscious post-ictal. Dr. Terrace Arabia suggested her sister keep Depakote ER 500 mg 2 tablets every night, Keppra 750 mg twice a day, for better seizure control, but I worry about the side effect of behavior issues with Keppra. I will also add on Lamictal, starting with 25 mg, titrating to 100 mg twice a day. Sister states that she has rash all over, this has been going on for 2 months.   UPDATE 5/715 (LL): Patient is brought in today for sooner follow up after having seizure yesterday. She is accompanied by her brother-in-law. Dr. Terrace Arabia spoke to pt. Sister on phone with medicine changes, she has had 2 doses of new meds Keppra and Lamictal so far. Behavior issues continue to be challenging for family. Patient is very confused and combative after seizures. Patient is in good spirits today. Rash has been present all over body for 2 months, itches, no new medications since it started, no new detergents. They have not seen PCP for this.   UPDATE 12/12/13 (LL):  Patient returns to the office for revisit for seizures since she is accompanied by her brother-in-law again today. Since last visit, she has not been taking Lamictal or Keppra. When asked why, her brother-in-law states that the medication ran out and there were no more refills. She has continued to take Depakote  ER 1000 mg at bedtime. She has not had any seizures since last visit. Rash cleared up after stopping Lamictal and Keppra. Patient sister and brother-in-law control her medication administration. Her behavior issues seemed to have calm down somewhat.  REVIEW OF SYSTEMS: Full 14 system review of systems performed and notable only for:  Tremors, seizure  ALLERGIES: Allergies  Allergen Reactions  . Penicillins Other (See  Comments)    unknown    HOME MEDICATIONS: Outpatient Prescriptions Prior to Visit  Medication Sig Dispense Refill  . CRANBERRY EXTRACT PO Take 3 tablets by mouth daily.       . divalproex (DEPAKOTE ER) 500 MG 24 hr tablet Take 2 tablets (1,000 mg total) by mouth daily.  60 tablet  12  . lamoTRIgine (LAMICTAL) 25 MG tablet One tab po bid xone week, then 2 tabs po bid xone week, then 3 tabs po bid.  100 tablet  0  . levETIRAcetam (KEPPRA) 750 MG tablet Take 1 tablet (750 mg total) by mouth 2 (two) times daily.  60 tablet  5   No facility-administered medications prior to visit.    PHYSICAL EXAM Filed Vitals:   12/12/13 1413  BP: 98/72  Pulse: 56  Height: 5\' 5"  (1.651 m)  Weight: 153 lb (69.4 kg)   Body mass index is 25.46 kg/(m^2).  Generalized: In no acute distress  Neck: Supple, no carotid bruits  Cardiac: Regular rate rhythm  Pulmonary: Clear to auscultation bilaterally  Musculoskeletal: No deformity   Neurological examination  Mentation: alert, pleasant, compliance with physical exam.  Cranial nerve II-XII: Pupils were equal round reactive to light extraocular movements were full, Visual field were full on confrontational test. Facial sensation and strength were normal. Hearing was intact to finger rubbing bilaterally. Uvula tongue midline. head turning and shoulder shrug and were normal and symmetric.Tongue protrusion into cheek strength was normal. She has gingiva hypertrophy  Motor: normal tone, bulk and strength.  Sensory: not reliable.  Coordination: Normal finger to nose, heel-to-shin bilaterally there was no truncal ataxia  Gait: Hold her left arm in elbow flexion, dragging her left leg some.   DIAGNOSTIC DATA (LABS, IMAGING, TESTING) - I reviewed patient records, labs, notes, testing and imaging myself where available.  Lab Results  Component Value Date   WBC 5.2 08/08/2013   HGB 13.3 08/08/2013   HCT 37.6 08/08/2013   MCV 97.2 08/08/2013   PLT 123* 08/08/2013       Component Value Date/Time   NA 137 08/08/2013 0901   NA 145* 04/27/2013 1141   K 4.1 08/08/2013 0901   CL 98 08/08/2013 0901   CO2 27 08/08/2013 0901   GLUCOSE 82 08/08/2013 0901   GLUCOSE 85 04/27/2013 1141   BUN 10 08/08/2013 0901   BUN 12 04/27/2013 1141   CREATININE 0.62 08/08/2013 0901   CALCIUM 8.9 08/08/2013 0901   PROT 7.1 04/27/2013 1141   AST 19 04/27/2013 1141   ALT 18 04/27/2013 1141   ALKPHOS 78 04/27/2013 1141   BILITOT <0.2 04/27/2013 1141   GFRNONAA >90 08/08/2013 0901   GFRAA >90 08/08/2013 0901    ASSESSMENT: 43 y.o. Caucasian female, with childhood meningitis, with seizures, also behavior issues. She had been on long-term Dilantin treatment, supposed to take 500 mg a day, the level was only 1.8, likely she had not been compliant with her medications. Her medications are controlled now by sister and brother-in-law.  She was started on Keppra and Lamictal 3 months ago and developed generalized rash,  family did not get these medications refilled and did not call the office for refills. She has been seizure-free on Depakote ER alone for about 2 months.  PLAN: I had a long discussion with the patient and family regarding her seizures and answered questions.  Continue Depakote ER 500 mg 2 tablets every night. Brother-in-law is instructed to contact the office if she has any seizures. Followup in 6 months with Dr. Terrace ArabiaYan, sooner as needed.  Tawny AsalLYNN E. Brynlee Pennywell, MSN, FNP-BC, A/GNP-C 12/12/2013, 2:32 PM [facility] [address], [address] [place], [place] [address] [phone]  Note: This document was prepared with digital dictation and possible smart phrase technology. Any transcriptional errors that result from this process are unintentional.

## 2014-02-12 ENCOUNTER — Emergency Department (HOSPITAL_COMMUNITY)
Admission: EM | Admit: 2014-02-12 | Discharge: 2014-02-13 | Disposition: A | Discharge status: Home / Self Care | Payer: Medicare Other | Attending: Emergency Medicine | Admitting: Emergency Medicine

## 2014-02-12 ENCOUNTER — Encounter (HOSPITAL_COMMUNITY): Payer: Self-pay | Admitting: Emergency Medicine

## 2014-02-12 ENCOUNTER — Emergency Department (HOSPITAL_COMMUNITY): Payer: Medicare Other

## 2014-02-12 DIAGNOSIS — S82899A Other fracture of unspecified lower leg, initial encounter for closed fracture: Secondary | ICD-10-CM | POA: Diagnosis not present

## 2014-02-12 DIAGNOSIS — G40909 Epilepsy, unspecified, not intractable, without status epilepticus: Secondary | ICD-10-CM | POA: Diagnosis not present

## 2014-02-12 DIAGNOSIS — R0602 Shortness of breath: Secondary | ICD-10-CM | POA: Diagnosis not present

## 2014-02-12 DIAGNOSIS — Z880 Allergy status to penicillin: Secondary | ICD-10-CM | POA: Insufficient documentation

## 2014-02-12 DIAGNOSIS — Y92009 Unspecified place in unspecified non-institutional (private) residence as the place of occurrence of the external cause: Secondary | ICD-10-CM | POA: Diagnosis not present

## 2014-02-12 DIAGNOSIS — Z79899 Other long term (current) drug therapy: Secondary | ICD-10-CM | POA: Insufficient documentation

## 2014-02-12 DIAGNOSIS — S8990XA Unspecified injury of unspecified lower leg, initial encounter: Secondary | ICD-10-CM | POA: Insufficient documentation

## 2014-02-12 DIAGNOSIS — S99929A Unspecified injury of unspecified foot, initial encounter: Secondary | ICD-10-CM

## 2014-02-12 DIAGNOSIS — Y939 Activity, unspecified: Secondary | ICD-10-CM | POA: Diagnosis not present

## 2014-02-12 DIAGNOSIS — R296 Repeated falls: Secondary | ICD-10-CM | POA: Diagnosis not present

## 2014-02-12 DIAGNOSIS — S99919A Unspecified injury of unspecified ankle, initial encounter: Secondary | ICD-10-CM | POA: Diagnosis present

## 2014-02-12 DIAGNOSIS — S82402A Unspecified fracture of shaft of left fibula, initial encounter for closed fracture: Secondary | ICD-10-CM

## 2014-02-12 NOTE — ED Notes (Signed)
Pt coming from home with c/o left ankle pain per the family. Family states they heard pt fall up stairs and found pt on the floor of her room. Pt does not remember the fall, but that is normal per the family. Pt c/o bilateral ankle pain and states she always has pain in her ankles. Pt's family says she was favoring her left ankle. Pt has good ROM and CMS is intact in both ankles

## 2014-02-12 NOTE — Discharge Instructions (Signed)
Please follow the directions provided.  Be sure to follow up with the bone doctor in the am.  You may take tylenol 650 mg by mouth every 4 hours or ibuprofen 400 mg by mouth every 6 hours for pain.  If you have any new or worsening concerns, don't hesitate to come back.    SEEK MEDICAL CARE IF:  You have continued pain or more swelling  The medications do not control the pain. SEEK IMMEDIATE MEDICAL CARE IF:  You develop severe pain in the leg or foot.  Your skin or nails below the injury turn blue or grey or feel cold or numb.

## 2014-02-12 NOTE — ED Provider Notes (Signed)
CSN: [id number]     Arrival date & time 02/12/14  2104 History  This chart was scribed for Susan Battiest, NP working with Hurman Horn, MD by Evon Slack, ED Scribe. This patient was seen in room TR06C/TR06C and the patient's care was started at 9:59 PM.    Chief Complaint  Patient presents with  . Ankle Injury  . Fall   The history is provided by the patient. No language interpreter was used.   HPI Comments: BERTINA GUTHRIDGE is a 43 y.o. female who presents to the Emergency Department complaining of left ankle injury onset this afternoon. She has associated swelling and bruising to the ankle. Family states she usually sits at her table and does puzzles.  They heard a "thud" upstairs and found pt on the floor with legs underneath her.  Does not appear to have fallen from height but since has been complaining of pain in the left ankle. They noticed swelling and states she is able to ambulate. She states that she always has ankle pain at baseline. Denies knee pain.  Past Medical History  Diagnosis Date  . Seizure   . Meningitis spinal   . Memory loss    Past Surgical History  Procedure Laterality Date  . None     Family History  Problem Relation Age of Onset  . Diabetes Mother   . Hypertension Sister   . Diabetes Maternal Grandmother   . Hyperlipidemia Sister    History  Substance Use Topics  . Smoking status: Never Smoker   . Smokeless tobacco: Not on file  . Alcohol Use: Yes   OB History   Grav Para Term Preterm Abortions TAB SAB Ect Mult Living                 Review of Systems  Respiratory: Positive for shortness of breath.   Musculoskeletal: Positive for arthralgias and joint swelling. Negative for gait problem.  Skin: Positive for color change.    Allergies  Penicillins  Home Medications   Prior to Admission medications   Medication Sig Start Date End Date Taking? Authorizing Provider  CRANBERRY EXTRACT PO Take 1 tablet by mouth daily.    Yes  Historical Provider, MD  divalproex (DEPAKOTE ER) 500 MG 24 hr tablet Take 2 tablets (1,000 mg total) by mouth daily. 04/27/13  Yes Levert Feinstein, MD   Triage Vitals: BP 99/63  Pulse 60  Temp(Src) 97.5 F (36.4 C) (Oral)  Resp 18  SpO2 100%  Physical Exam  Nursing note and vitals reviewed. Constitutional: She is oriented to person, place, and time. She appears well-developed and well-nourished. No distress.  HENT:  Head: Normocephalic and atraumatic.  No tenderness to palpation to head  Eyes: Conjunctivae and EOM are normal.  Neck: Neck supple. No tracheal deviation present.  Cardiovascular: Normal rate.   Pulses:      Dorsalis pedis pulses are 2+ on the right side, and 2+ on the left side.  Pulmonary/Chest: Effort normal. No respiratory distress.  Musculoskeletal: She exhibits tenderness.       Left ankle: She exhibits swelling. Tenderness. Lateral malleolus tenderness found.  Swelling and tender to palpation left lateral ankle, no bony tenderness to cervical, thoracic or lumbar spine, no  tenderness to hips or knees  Neurological: She is alert and oriented to person, place, and time. She has normal strength. A cranial nerve deficit is present. No sensory deficit. GCS eye subscore is 4. GCS verbal subscore is 5. GCS motor subscore  is 6.  5/5 strength on plantar and dorsi flexion, good motor and sensation  Skin: Skin is warm and dry.  Psychiatric: She has a normal mood and affect. Her behavior is normal.    ED Course  Procedures (including critical care time) DIAGNOSTIC STUDIES: Oxygen Saturation is 100% on RA, normal by my interpretation.    Labs Review Labs Reviewed - No data to display  Imaging Review Dg Ankle Complete Left  02/12/2014   CLINICAL DATA:  Fall, left ankle pain  EXAM: LEFT ANKLE COMPLETE - 3+ VIEW  COMPARISON:  None.  FINDINGS: Minimally displaced oblique distal fibular fracture.  Mild irregularity of the medial malleolus is likely a chronic posttraumatic  deformity.  The ankle mortise is otherwise intact.  The base of the fifth metatarsal is unremarkable.  Moderate soft tissue swelling overlying the lateral malleolus.  IMPRESSION: Minimally displaced oblique distal fibular fracture.   Electronically Signed   By: Charline Bills M.D.   On: 02/12/2014 23:13     EKG Interpretation None      MDM   Final diagnoses:  Fibula fracture, left, closed, initial encounter   Pt is 43 yo female with intellectual disability, cared for by sister.  Pain and swelling after fall from seated position.  Pain meds deferred due to pt's content affect and sister reports pt already received her night anti-epileptic meds.  Cam walker placed by ortho tech. Discharge instructions include symptomatic mgmt, instructions to call orthopedics in am for follow-up.  Family and pt are agreeable with plan.  Return precautions provided.  I personally performed the services described in this documentation, which was scribed in my presence. The recorded information has been reviewed and is accurate.  Filed Vitals:   02/12/14 2115 02/12/14 2346  BP: 99/63 101/64  Pulse: 60 60  Temp: 97.5 F (36.4 C) 97.6 F (36.4 C)  TempSrc: Oral Oral  Resp: 18 16  SpO2: 100% 100%   Meds given in ED:  Medications - No data to display  Discharge Medication List as of 02/12/2014 11:35 PM        Susan Battiest, NP 02/17/14 1610

## 2014-02-12 NOTE — ED Notes (Signed)
Ortho paged for [employer].

## 2014-02-21 NOTE — ED Provider Notes (Signed)
Medical screening examination/treatment/procedure(s) were performed by non-physician practitioner and as supervising physician I was immediately available for consultation/collaboration.   EKG Interpretation None       Eryanna Regal M Amyre Segundo, MD 02/21/14 2043 

## 2014-04-27 ENCOUNTER — Ambulatory Visit: Payer: Medicare Other | Admitting: Neurology

## 2014-05-14 ENCOUNTER — Other Ambulatory Visit: Payer: Self-pay | Admitting: Neurology

## 2014-05-14 ENCOUNTER — Ambulatory Visit: Payer: Medicare Other | Admitting: Neurology

## 2014-05-15 ENCOUNTER — Encounter: Payer: Self-pay | Admitting: Neurology

## 2014-05-15 ENCOUNTER — Ambulatory Visit (INDEPENDENT_AMBULATORY_CARE_PROVIDER_SITE_OTHER): Payer: Medicare Other | Admitting: Neurology

## 2014-05-15 VITALS — Wt 156.0 lb

## 2014-05-15 DIAGNOSIS — R569 Unspecified convulsions: Secondary | ICD-10-CM

## 2014-05-15 DIAGNOSIS — F79 Unspecified intellectual disabilities: Secondary | ICD-10-CM

## 2014-05-15 MED ORDER — DIVALPROEX SODIUM ER 500 MG PO TB24
ORAL_TABLET | ORAL | Status: DC
Start: 2014-05-15 — End: 2014-11-16

## 2014-05-15 NOTE — Progress Notes (Signed)
PATIENT: Susan Diaz DOB: 07/11/1970  REASON FOR VISIT: routine follow up for epilepsy HISTORY FROM: patient  HISTORY OF PRESENT ILLNESS: Susan Diaz is a 43 years old right-handed Caucasian female, accompanied by her sister Susan Diaz, at today's clinical visit, she is referred by her primary care physician Dr. Cain Saupeammie Fulp for evaluation of seizure, and behavior issues  Susan Diaz and her sister [name] just moved from [place] to [place] since August 2014, she has lived with her sister since 2010, before that, she lived with her brother, and her mother.   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   She tends to over eat, has behavior issue, her weight was up to 270 pounds in 2010, which has much improved, sister reported that she has been managing her own seizure medications, Dilantin 100 mg 2 tablets in the morning, one tablet at noon, 2 tablets at evening. She tends to have recurrent seizure, most recent one was 2 weeks ago, she becomes confused, sister reported her seizure is often triggered by chocolate, and caffeine, but reviewed most recent laboratory evaluation, normal CBC, CMP, Dilantin level was only 1. 8, suggesting that she has not compliant with her medications.   Her family is also very frustrated about Jasira's behavior issues, she is forgetful, does not know where to put the dishes back into its place, even though she has done the same job over and over, sometimes get agitated, combative.  She was switched from Dilantin to Depakote since 2014  Normal MRI brain (with and without) in January 2015 Incidental right frontal, parasagittal developmental venous anomaly.  Over the past 2 years, we have tried Depakote, along with Keppra, worsening behavior issues, Lamictal, rashes, now she is only taking Depakote ER 500 mg 2 tablets every  night  She continue has occasionally breakthrough seizures, March 20 fourth, Sep 20, 2013  UPDATE Dec 29th 2015: She came seen with her brother-in-law at today's visit, she is tolerating Depakote ER 500 mg every night, there was no significant side effect, She continued to have tendency to overeat, sometimes woke up in the middle of the night wandering around the house, has no recollection of the event, but there was no seizure-like activity noticed   REVIEW OF SYSTEMS: Full 14 system review of systems performed and notable only for:  Unexpected weight change, confusion, incontinence of bladder, rash  ALLERGIES: Allergies  Allergen Reactions  . Penicillins Other (See Comments)    unknown    HOME MEDICATIONS: Outpatient Prescriptions Prior to Visit  Medication Sig Dispense Refill  . CRANBERRY EXTRACT PO Take 1 tablet by mouth daily.     . divalproex (DEPAKOTE ER) 500 MG 24 hr tablet TAKE TWO TABLETS BY MOUTH ONCE DAILY 60 tablet 0   No facility-administered medications prior to visit.    PHYSICAL EXAM Filed Vitals:   05/15/14 1056  Weight: 156 lb (70.761 kg)   Body mass index is 25.96 kg/(m^2).  Generalized: In no acute distress  Neck: Supple, no carotid bruits  Cardiac: Regular rate rhythm  Pulmonary: Clear to auscultation bilaterally  Musculoskeletal: No deformity   Neurological examination  Mentation: alert, pleasant, compliance with physical exam.  Cranial nerve II-XII: Pupils were equal round reactive to light extraocular movements were full, Visual field were full on confrontational test. Facial sensation and strength were normal. Hearing was intact to  finger rubbing bilaterally. Uvula tongue midline. head turning and shoulder shrug and were normal and symmetric.Tongue protrusion into cheek strength was normal. She has gingiva hypertrophy  Motor: normal tone, bulk and strength.  Sensory: not reliable.  Coordination: Normal finger to nose, heel-to-shin bilaterally  there was no truncal ataxia  Gait: Hold her left arm in elbow flexion, dragging her left leg some.   DIAGNOSTIC DATA (LABS, IMAGING, TESTING) - I reviewed patient records, labs, notes, testing and imaging myself where available.  Lab Results  Component Value Date   WBC 5.2 08/08/2013   HGB 13.3 08/08/2013   HCT 37.6 08/08/2013   MCV 97.2 08/08/2013   PLT 123* 08/08/2013      Component Value Date/Time   NA 137 08/08/2013 0901   NA 145* 04/27/2013 1141   K 4.1 08/08/2013 0901   CL 98 08/08/2013 0901   CO2 27 08/08/2013 0901   GLUCOSE 82 08/08/2013 0901   GLUCOSE 85 04/27/2013 1141   BUN 10 08/08/2013 0901   BUN 12 04/27/2013 1141   CREATININE 0.62 08/08/2013 0901   CALCIUM 8.9 08/08/2013 0901   PROT 7.1 04/27/2013 1141   AST 19 04/27/2013 1141   ALT 18 04/27/2013 1141   ALKPHOS 78 04/27/2013 1141   BILITOT <0.2 04/27/2013 1141   GFRNONAA >90 08/08/2013 0901   GFRAA >90 08/08/2013 0901    ASSESSMENT/PLAN: 43 y.o. Caucasian female, with childhood meningitis, with seizures, also behavior issues. She had been on long-term Dilantin treatment, but has developed osteoporosis, toxicity in the past, she lives with her sister, tolerating Depakote ER 500 mg 2 tablets every night, but has occasionally breakthrough seizures ,   Increased Depakote ER 500 mg to 3 tablets every night Return to clinic in 6 months   Levert FeinsteinYijun Kemyra August, M.D. Ph.D.  [facility] [address] [place], [place] [address] Phone: [phone] Fax:      [phone]

## 2014-05-16 LAB — CBC
HEMATOCRIT: 38.8 % (ref 34.0–46.6)
HEMOGLOBIN: 13.4 g/dL (ref 11.1–15.9)
MCH: 33.3 pg — AB (ref 26.6–33.0)
MCHC: 34.5 g/dL (ref 31.5–35.7)
MCV: 96 fL (ref 79–97)
Platelets: 176 10*3/uL (ref 150–379)
RBC: 4.03 x10E6/uL (ref 3.77–5.28)
RDW: 13.1 % (ref 12.3–15.4)
WBC: 4.3 10*3/uL (ref 3.4–10.8)

## 2014-05-16 LAB — COMPREHENSIVE METABOLIC PANEL
ALT: 10 IU/L (ref 0–32)
AST: 15 IU/L (ref 0–40)
Albumin/Globulin Ratio: 1.3 (ref 1.1–2.5)
Albumin: 3.7 g/dL (ref 3.5–5.5)
Alkaline Phosphatase: 51 IU/L (ref 39–117)
BUN/Creatinine Ratio: 17 (ref 9–23)
BUN: 11 mg/dL (ref 6–24)
CALCIUM: 9.2 mg/dL (ref 8.7–10.2)
CHLORIDE: 97 mmol/L (ref 97–108)
CO2: 26 mmol/L (ref 18–29)
Creatinine, Ser: 0.64 mg/dL (ref 0.57–1.00)
GFR calc Af Amer: 125 mL/min/{1.73_m2} (ref >59)
GFR calc non Af Amer: 108 mL/min/{1.73_m2} (ref >59)
Globulin, Total: 2.9 g/dL (ref 1.5–4.5)
Glucose: 55 mg/dL — ABNORMAL LOW (ref 65–99)
Potassium: 4.3 mmol/L (ref 3.5–5.2)
Sodium: 137 mmol/L (ref 134–144)
TOTAL PROTEIN: 6.6 g/dL (ref 6.0–8.5)
Total Bilirubin: 0.5 mg/dL (ref 0.0–1.2)

## 2014-05-16 LAB — VALPROIC ACID LEVEL: VALPROIC ACID LVL: 94 ug/mL (ref 50–100)

## 2014-11-14 ENCOUNTER — Encounter: Payer: Self-pay | Admitting: Nurse Practitioner

## 2014-11-14 ENCOUNTER — Ambulatory Visit (INDEPENDENT_AMBULATORY_CARE_PROVIDER_SITE_OTHER): Payer: Medicare Other | Admitting: Nurse Practitioner

## 2014-11-14 VITALS — BP 93/63 | HR 62 | Ht 66.25 in | Wt 159.0 lb

## 2014-11-14 DIAGNOSIS — Z5181 Encounter for therapeutic drug level monitoring: Secondary | ICD-10-CM

## 2014-11-14 DIAGNOSIS — F79 Unspecified intellectual disabilities: Secondary | ICD-10-CM

## 2014-11-14 DIAGNOSIS — R569 Unspecified convulsions: Secondary | ICD-10-CM

## 2014-11-14 NOTE — Progress Notes (Signed)
[facility]  PATIENT: Susan Diaz DOB: 11-26-1970   REASON FOR VISIT: Follow-up for seizure disorder, mental retardation HISTORY FROM: Brother in law    HISTORY OF PRESENT ILLNESS:Esmae is a 44 years old right-handed Caucasian female, accompanied by her sister [name], at today's clinical visit, she is referred by her primary care physician Dr. Cain Saupeammie Fulp for evaluation of seizure, and behavior issues  [name] and her sister [name] just moved from [place] to [place] since August 2014, she has lived with her sister since 2010, before that, she lived with her brother, and her mother.   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 She tends to over eat, has behavior issue, her weight was up to 270 pounds in 2010, which has much improved, sister reported that she has been managing her own seizure medications, Dilantin 100 mg 2 tablets in the morning, one tablet at noon, 2 tablets at evening. She tends to have recurrent seizure, most recent one was 2 weeks ago, she becomes confused, sister reported her seizure is often triggered by chocolate, and caffeine, but reviewed most recent laboratory evaluation, normal CBC, CMP, Dilantin level was only 1. 8, suggesting that she has not compliant with her medications. Her family is also very frustrated about Jilda's behavior issues, she is forgetful, does not know where to put the dishes back into its place, even though she has done the same job over and over, sometimes get agitated, combative. She was switched from Dilantin to Depakote since 2014 Normal MRI brain (with and without) in January 2015 Incidental right frontal, parasagittal developmental venous anomaly.  Over the past 2 years, we have tried Depakote, along with Keppra, worsening behavior issues, Lamictal, rashes, now she is  only taking Depakote ER 500 mg 2 tablets every night She continue has occasionally breakthrough seizures, March 20 fourth, Sep 20, 2013  UPDATE Dec 29th 2015: She came seen with her brother-in-law at today's visit, she is tolerating Depakote ER 500 mg every night, there was no significant side effect, She continued to have tendency to overeat, sometimes woke up in the middle of the night wandering around the house, has no recollection of the event, but there was no seizure-like activity noticed UPDATE 11/14/2014 Ms. Roselind MessierKinard, 44 year old female returns for follow-up. She was last seen by Dr. Terrace ArabiaYan 05/15/2014. She continues to have about one seizure a month however this past week she had 3 seizures during the day and one seizure at night. She is taking her medication as prescribed Depakote 500mg  3 at bedtime. Her last valproic acid level was therapeutic. Her seizure activity is described as staring off and unable to communicate with her environment .She returns for reevaluation  REVIEW OF SYSTEMS: Full 14 system review of systems performed and notable only for those listed, all others are neg:  Constitutional: neg  Cardiovascular: neg Ear/Nose/Throat: neg  Skin: neg Eyes: neg Respiratory: neg Gastroitestinal: neg  Hematology/Lymphatic: neg  Endocrine: neg Musculoskeletal:neg Allergy/Immunology: neg Neurological: Seizure Psychiatric: neg Sleep : neg   ALLERGIES: Allergies  Allergen Reactions  . Penicillins Other (See Comments)    unknown    HOME MEDICATIONS: Outpatient Prescriptions Prior to Visit  Medication Sig Dispense Refill  . CRANBERRY EXTRACT PO Take 1 tablet by mouth daily.     . divalproex (DEPAKOTE ER) 500 MG 24 hr tablet Take 3 tabs  po every night. 90 tablet 11   No facility-administered medications prior to visit.    PAST MEDICAL HISTORY: Past Medical History  Diagnosis Date  . Seizure   . Meningitis spinal   . Memory loss   . Incontinence     > 2 yrs    PAST  SURGICAL HISTORY: Past Surgical History  Procedure Laterality Date  . None      FAMILY HISTORY: Family History  Problem Relation Age of Onset  . Diabetes Mother   . Hypertension Sister   . Diabetes Maternal Grandmother   . Hyperlipidemia Sister     SOCIAL HISTORY: History   Social History  . Marital Status: Single    Spouse Name: N/A  . Number of Children: 0  . Years of Education: 12TH   Occupational History  . DISABLED    Social History Main Topics  . Smoking status: Never Smoker   . Smokeless tobacco: Never Used  . Alcohol Use: 0.0 oz/week    0 Standard drinks or equivalent per week  . Drug Use: No  . Sexual Activity: Not on file   Other Topics Concern  . Not on file   Social History Narrative   Patient lives with her sister  and husband.    Education high school.   Right handed.   Caffeine none.     PHYSICAL EXAM  Filed Vitals:   11/14/14 1418  BP: 93/63  Pulse: 62  Height: 5' 6.25" (1.683 m)  Weight: 159 lb (72.122 kg)   Body mass index is 25.46 kg/(m^2). Generalized: In no acute distress  Neck: Supple, no carotid bruits  Cardiac: Regular rate rhythm  Musculoskeletal: No deformity   Neurological examination  Mentation: alert, pleasant, compliant with physical exam.  Cranial nerve II-XII: Pupils were equal round reactive to light extraocular movements were full, Visual field were full on confrontational test. Facial sensation and strength were normal. Hearing was intact to finger rubbing bilaterally. Uvula tongue midline. head turning and shoulder shrug and were normal and symmetric.Tongue protrusion into cheek strength was normal. She has gingiva hypertrophy  Motor: normal tone, bulk and strength.  Sensory: not reliable.  Coordination: Normal finger to nose, heel-to-shin bilaterally there was no truncal ataxia  Gait: Hold her left arm in elbow flexion, dragging her left leg some. No assistive device, no difficulty with  turns   DIAGNOSTIC DATA (LABS, IMAGING, TESTING) - I reviewed patient records, labs, notes, testing and imaging myself where available.  Lab Results  Component Value Date   WBC 4.3 05/15/2014   HGB 13.4 05/15/2014   HCT 38.8 05/15/2014   MCV 96 05/15/2014   PLT 176 05/15/2014      Component Value Date/Time   NA 137 05/15/2014 1141   NA 137 08/08/2013 0901   K 4.3 05/15/2014 1141   CL 97 05/15/2014 1141   CO2 26 05/15/2014 1141   GLUCOSE 55* 05/15/2014 1141   GLUCOSE 82 08/08/2013 0901   BUN 11 05/15/2014 1141   BUN 10 08/08/2013 0901   CREATININE 0.64 05/15/2014 1141   CALCIUM 9.2 05/15/2014 1141   PROT 6.6 05/15/2014 1141   AST 15 05/15/2014 1141   ALT 10 05/15/2014 1141   ALKPHOS 51 05/15/2014 1141   BILITOT 0.5 05/15/2014 1141   GFRNONAA 108 05/15/2014 1141   GFRAA 125 05/15/2014 1141    ASSESSMENT AND PLAN  44 y.o. year old female  has a past medical history of Seizure; Meningitis spinal; Memory loss; and Incontinence. here  to follow-up for seizure disorder. She has about one seizure per month however in the last week has had 3 seizures during the daytime and 1 at night. Last VPA level was therapeutic She is currently taking 1500 mg at night extended release  Increase Depakote to 4 tabs at night Will check level today then refill Follow up in 6 months Nilda Riggs, [facility], [address] [facility], APRN  [facility] [address] [place], [place] [address] [phone]

## 2014-11-14 NOTE — Patient Instructions (Signed)
Increase Depakote to 4 tabs at night Will check level today Follow up in 6 months

## 2014-11-15 ENCOUNTER — Telehealth: Payer: Self-pay | Admitting: *Deleted

## 2014-11-15 LAB — VALPROIC ACID LEVEL: Valproic Acid Lvl: 100 ug/mL (ref 50–100)

## 2014-11-15 NOTE — Telephone Encounter (Signed)
Keep a log for 3 to 4 weeks

## 2014-11-15 NOTE — Telephone Encounter (Signed)
I spoke to [name] re: the lab results and Dr. Zannie CoveYan's recommendation about keeping on Depakote ER 500mg   3 tabs daily, keeping a seizure log and may add lamictal.  Was not sure of time frame of how long to keep seizure log and add of lamictal.   Would let Eber JonesCarolyn know.  Would call her back.

## 2014-11-15 NOTE — Progress Notes (Signed)
I have reviewed and agreed above plan. 

## 2014-11-16 ENCOUNTER — Other Ambulatory Visit: Payer: Self-pay | Admitting: Nurse Practitioner

## 2014-11-16 MED ORDER — DIVALPROEX SODIUM ER 500 MG PO TB24: ORAL_TABLET | ORAL | Status: AC

## 2014-11-16 NOTE — Telephone Encounter (Signed)
I spoke to [name], sister of pt.   Relayed to keep seizure log for 3-4 wks and then to fax, bring to us and from there make decision re: treatment.  She verbalized understanding.

## 2015-05-16 ENCOUNTER — Telehealth: Payer: Self-pay | Admitting: Neurology

## 2015-05-16 NOTE — Telephone Encounter (Signed)
Called and spoke to [name], the patients sister, who stated that they have since moved to [place] and she would like to know if we can refer her to a Neurologist in [place].  She became very upset discussing her previous visits as she feels no one is wanting to address the new issue of the pt's tremors. The sister stated that the tremors were in only one hand but now is affecting.  Sister stated that the pt spills drink when she is holding, and she even spilled hot chocolate on herself this past week.  The sister is very concerned about how the tremors have gotten worse in the past 2 months.

## 2015-05-21 ENCOUNTER — Ambulatory Visit: Payer: Medicare Other | Admitting: Nurse Practitioner

## 2015-05-21 NOTE — Telephone Encounter (Signed)
Left message for [name] (pt's sister on [employer]IP[employer]) to return my call.

## 2015-05-22 NOTE — Telephone Encounter (Signed)
Spoke to [name] - says she is only upset that the patient's tremors have gotten worse but she is not upset with the care her sister has been given at our office.  They have now moved and the drive is too far (16+45+ minutes) and they would like a referral to a practice closer to home.  She is going to get the name of the physician and I will call her back to get the information.

## 2015-05-22 NOTE — Telephone Encounter (Signed)
She would like a referral to Dr[facility] at [facility] - phone [phone].

## 2015-05-23 ENCOUNTER — Other Ambulatory Visit: Payer: Self-pay | Admitting: *Deleted

## 2015-05-23 DIAGNOSIS — F79 Unspecified intellectual disabilities: Secondary | ICD-10-CM

## 2015-05-23 DIAGNOSIS — R569 Unspecified convulsions: Secondary | ICD-10-CM

## 2015-05-23 NOTE — Telephone Encounter (Signed)
Ok per Dr. Terrace ArabiaYan - referral placed in Epic.

## 2015-10-16 ENCOUNTER — Telehealth: Payer: Self-pay | Admitting: Neurology

## 2015-10-16 ENCOUNTER — Other Ambulatory Visit: Payer: Self-pay | Admitting: *Deleted

## 2015-10-16 DIAGNOSIS — F79 Unspecified intellectual disabilities: Secondary | ICD-10-CM

## 2015-10-16 DIAGNOSIS — R569 Unspecified convulsions: Secondary | ICD-10-CM

## 2015-10-16 NOTE — Telephone Encounter (Signed)
Sister [name] called, states she called back in December to request that records be sent to Dr. Vallery Ridgeichard Bey. States they need records before they can see as new patient. Charna Busmandvised Karen that referral was sent 05/23/15. [name] states they still need records before they will see this patient.

## 2015-10-16 NOTE — Telephone Encounter (Signed)
New referral placed.

## 2015-10-16 NOTE — Telephone Encounter (Signed)
Called and spoke to sister [name]. Called and spoke with Dr. Vallery Ridgeichard Bey office and they relayed they don't have anything . I relayed we will resend . [name] was fine with this.  Dr. Vallery Ridgeichard Bey telephone [phone] fax [phone].  Marcelino DusterMichelle will place another referral and I will send . Thanks Annabelle Harmanana.

## 2015-10-24 NOTE — Telephone Encounter (Signed)
Noted referral has been sent °

## 2015-11-26 ENCOUNTER — Other Ambulatory Visit: Payer: Self-pay | Admitting: Nurse Practitioner

## 2016-05-25 IMAGING — CR DG ANKLE COMPLETE 3+V*L*
3 series · 3 of 3 positions shown · non-contrast
Comparison: None.

CLINICAL DATA: Fall, left ankle pain

EXAM:
LEFT ANKLE COMPLETE - 3+ VIEW

[t ankle joint ap left]
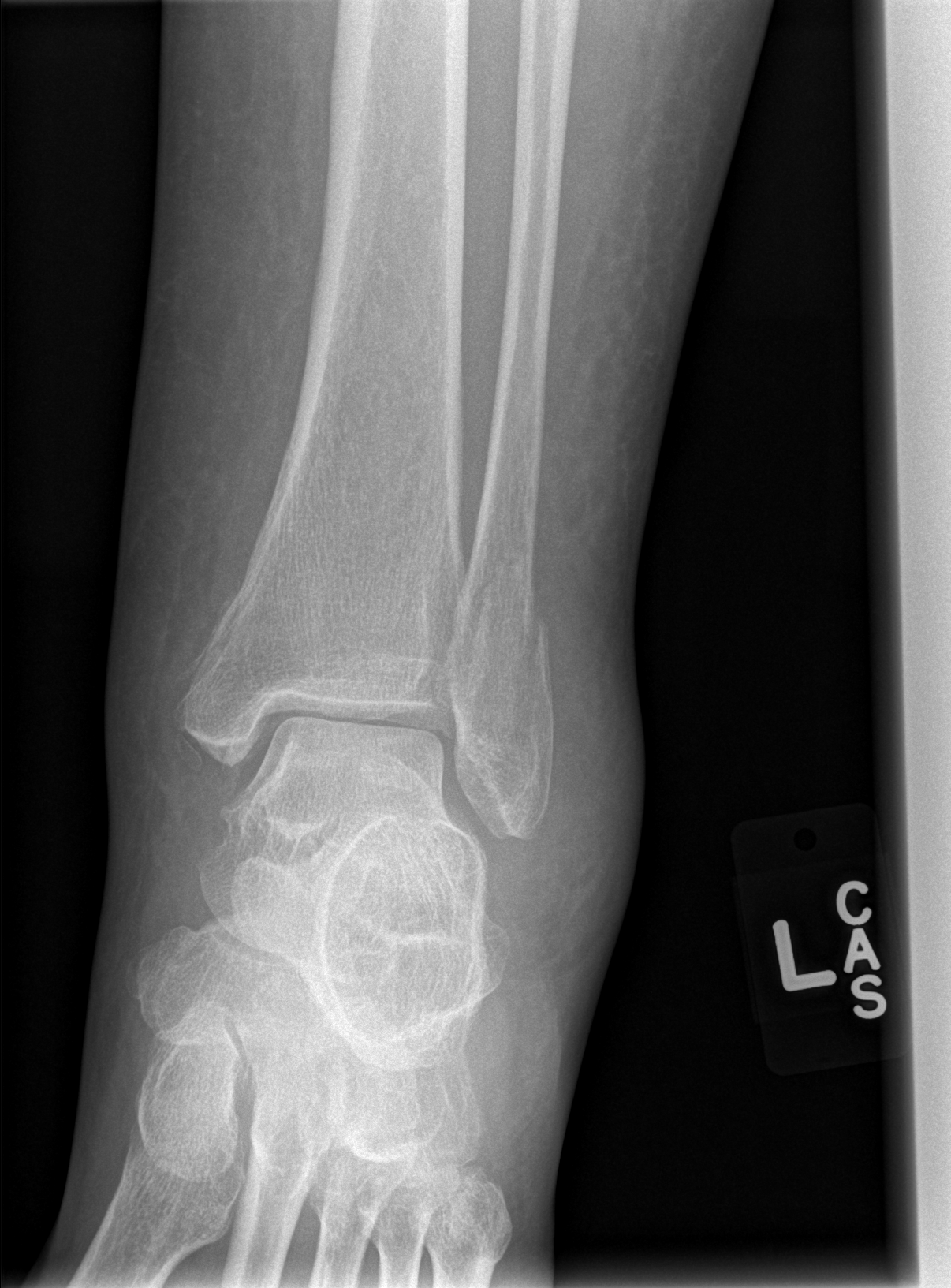

[t ankle joint oblique left]
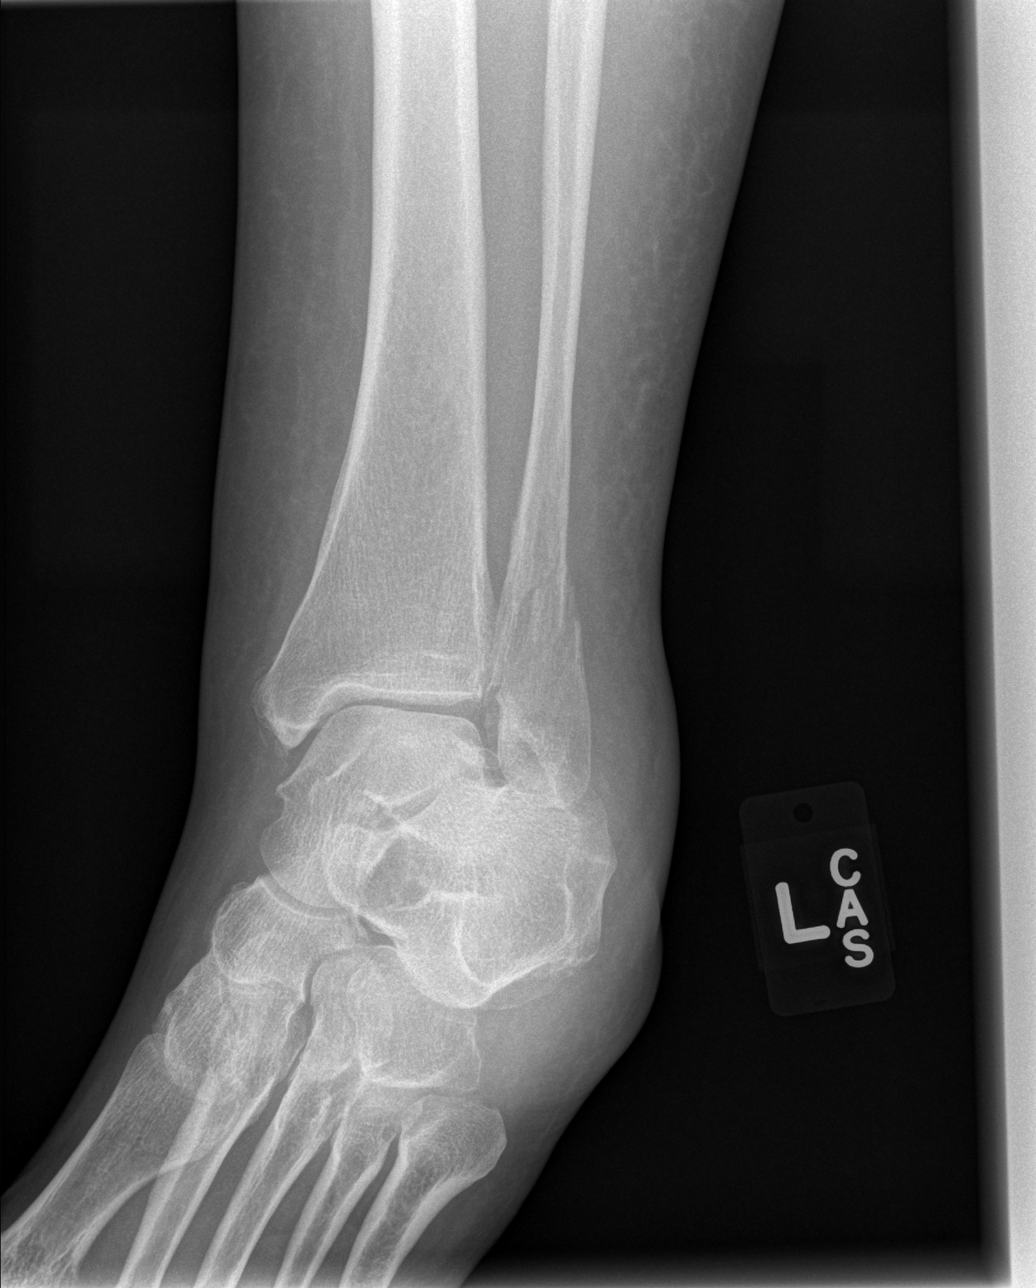

[t ankle joint lat left]
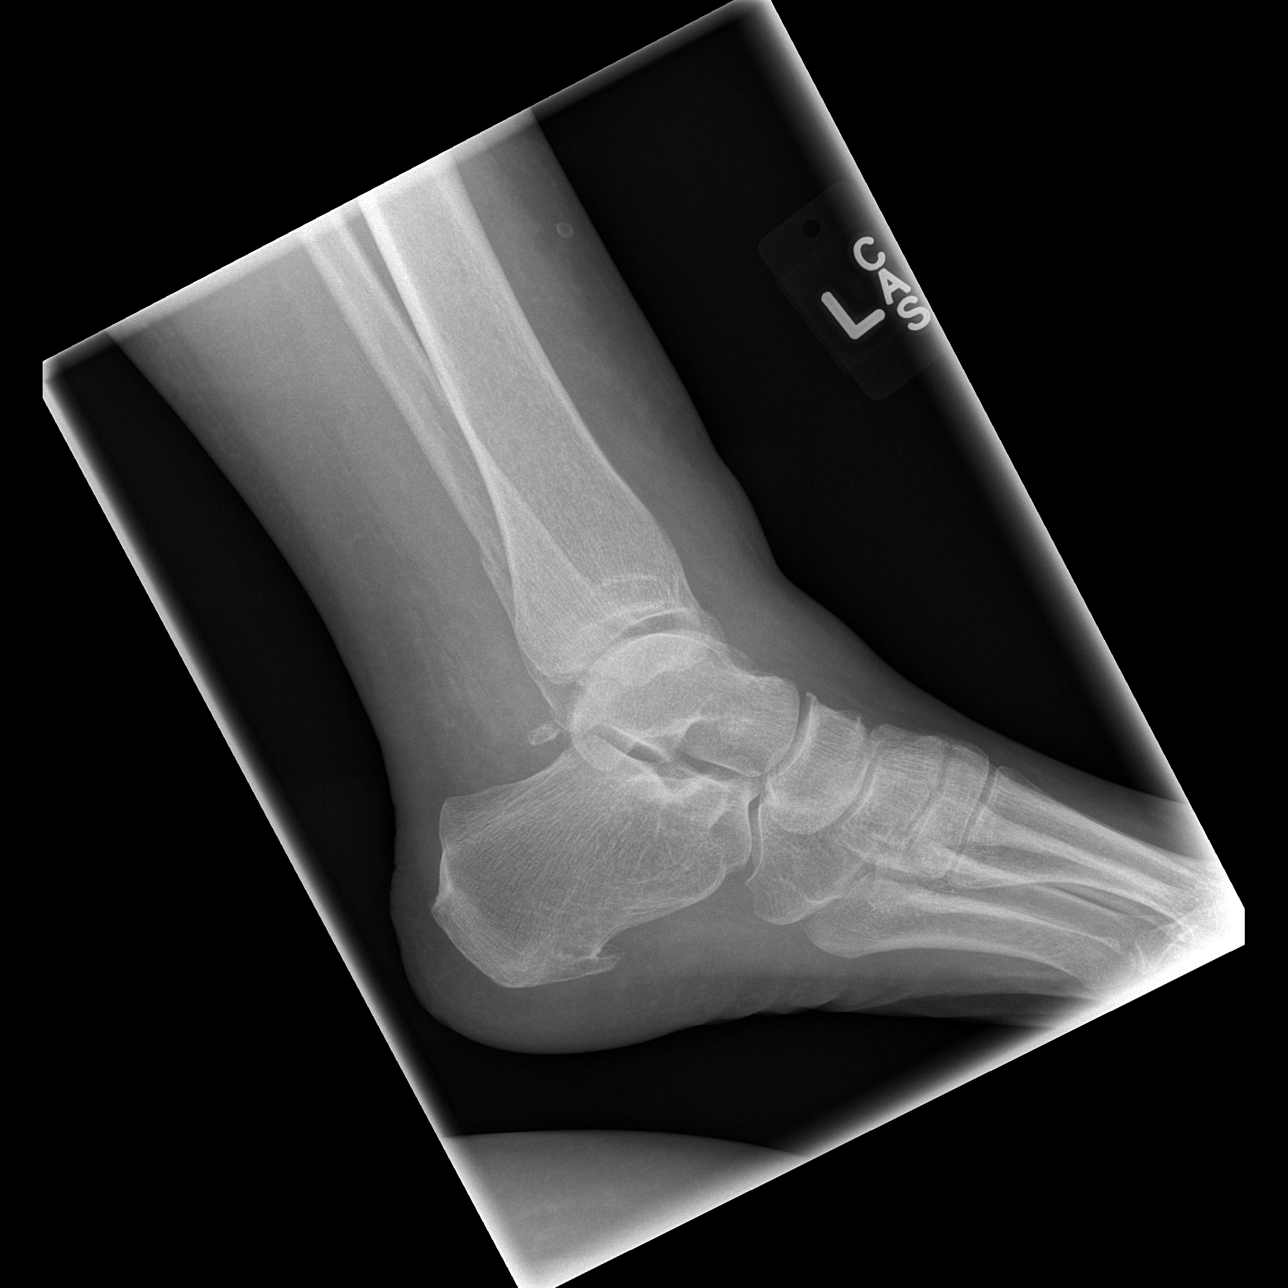

[3 of 3 positions shown; findings below may reference images not displayed]

FINDINGS: Minimally displaced oblique distal fibular fracture.

Mild irregularity of the medial malleolus is likely a chronic
posttraumatic deformity.

The ankle mortise is otherwise intact.

The base of the fifth metatarsal is unremarkable.

Moderate soft tissue swelling overlying the lateral malleolus.
IMPRESSION: Minimally displaced oblique distal fibular fracture.

## 2022-05-18 DEATH — deceased
